# Patient Record
Sex: Female | Born: 1955 | Race: Asian | Hispanic: No | Marital: Married | Smoking: Never smoker
Health system: Southern US, Community
[De-identification: ages and names within clinical notes are randomized; demographics above are authoritative.]

## PROBLEM LIST (undated history)

## (undated) DIAGNOSIS — E7800 Pure hypercholesterolemia, unspecified: Secondary | ICD-10-CM

## (undated) DIAGNOSIS — E119 Type 2 diabetes mellitus without complications: Secondary | ICD-10-CM

## (undated) DIAGNOSIS — I10 Essential (primary) hypertension: Secondary | ICD-10-CM

---

## 2019-11-25 ENCOUNTER — Emergency Department (HOSPITAL_COMMUNITY)
Admission: EM | Admit: 2019-11-25 | Discharge: 2019-11-25 | Disposition: A | Discharge status: Home / Self Care | Payer: BLUE CROSS/BLUE SHIELD | Attending: Emergency Medicine | Admitting: Emergency Medicine

## 2019-11-25 ENCOUNTER — Other Ambulatory Visit: Payer: Self-pay

## 2019-11-25 ENCOUNTER — Emergency Department (HOSPITAL_COMMUNITY): Payer: BLUE CROSS/BLUE SHIELD

## 2019-11-25 ENCOUNTER — Encounter (HOSPITAL_COMMUNITY): Payer: Self-pay | Admitting: Emergency Medicine

## 2019-11-25 DIAGNOSIS — I10 Essential (primary) hypertension: Secondary | ICD-10-CM | POA: Insufficient documentation

## 2019-11-25 DIAGNOSIS — R1032 Left lower quadrant pain: Secondary | ICD-10-CM | POA: Diagnosis not present

## 2019-11-25 DIAGNOSIS — R197 Diarrhea, unspecified: Secondary | ICD-10-CM | POA: Diagnosis not present

## 2019-11-25 DIAGNOSIS — E119 Type 2 diabetes mellitus without complications: Secondary | ICD-10-CM | POA: Diagnosis not present

## 2019-11-25 HISTORY — DX: Type 2 diabetes mellitus without complications: E11.9

## 2019-11-25 HISTORY — DX: Essential (primary) hypertension: I10

## 2019-11-25 HISTORY — DX: Pure hypercholesterolemia, unspecified: E78.00

## 2019-11-25 LAB — CBC
HCT: 41.7 % (ref 36.0–46.0)
Hemoglobin: 13.7 g/dL (ref 12.0–15.0)
MCH: 32.3 pg (ref 26.0–34.0)
MCHC: 32.9 g/dL (ref 30.0–36.0)
MCV: 98.3 fL (ref 80.0–100.0)
Platelets: 248 10*3/uL (ref 150–400)
RBC: 4.24 MIL/uL (ref 3.87–5.11)
RDW: 12.6 % (ref 11.5–15.5)
WBC: 5.1 10*3/uL (ref 4.0–10.5)
nRBC: 0 % (ref 0.0–0.2)

## 2019-11-25 LAB — COMPREHENSIVE METABOLIC PANEL
ALT: 16 U/L (ref 0–44)
AST: 19 U/L (ref 15–41)
Albumin: 4.4 g/dL (ref 3.5–5.0)
Alkaline Phosphatase: 85 U/L (ref 38–126)
Anion gap: 11 (ref 5–15)
BUN: 12 mg/dL (ref 8–23)
CO2: 25 mmol/L (ref 22–32)
Calcium: 9.2 mg/dL (ref 8.9–10.3)
Chloride: 103 mmol/L (ref 98–111)
Creatinine, Ser: 0.49 mg/dL (ref 0.44–1.00)
GFR calc Af Amer: >60 mL/min (ref >60)
GFR calc non Af Amer: >60 mL/min (ref >60)
Glucose, Bld: 104 mg/dL — ABNORMAL HIGH (ref 70–99)
Potassium: 3.9 mmol/L (ref 3.5–5.1)
Sodium: 139 mmol/L (ref 135–145)
Total Bilirubin: 1.1 mg/dL (ref 0.3–1.2)
Total Protein: 6.9 g/dL (ref 6.5–8.1)

## 2019-11-25 LAB — URINALYSIS, ROUTINE W REFLEX MICROSCOPIC
Bilirubin Urine: NEGATIVE
Glucose, UA: NEGATIVE mg/dL
Hgb urine dipstick: NEGATIVE
Ketones, ur: NEGATIVE mg/dL
Leukocytes,Ua: NEGATIVE
Nitrite: NEGATIVE
Protein, ur: NEGATIVE mg/dL
Specific Gravity, Urine: 1.019 (ref 1.005–1.030)
pH: 5 (ref 5.0–8.0)

## 2019-11-25 LAB — LIPASE, BLOOD: Lipase: 30 U/L (ref 11–51)

## 2019-11-25 MED ORDER — OMEPRAZOLE 40 MG PO CPDR: 40.0000 mg | DELAYED_RELEASE_CAPSULE | Freq: Every day | ORAL | 0 refills | Status: AC

## 2019-11-25 MED ORDER — SODIUM CHLORIDE 0.9% FLUSH: 3.0000 mL | Freq: Once | INTRAVENOUS | Status: DC

## 2019-11-25 MED ORDER — METRONIDAZOLE 500 MG PO TABS
500.0000 mg | ORAL_TABLET | Freq: Once | ORAL | Status: AC
  Administered 2019-11-25: 500 mg via ORAL
  Filled 2019-11-25: qty 1

## 2019-11-25 MED ORDER — ATORVASTATIN CALCIUM 40 MG PO TABS: 40.0000 mg | ORAL_TABLET | Freq: Every day | ORAL | 0 refills | Status: AC

## 2019-11-25 MED ORDER — METFORMIN HCL 500 MG PO TABS: 500.0000 mg | ORAL_TABLET | Freq: Two times a day (BID) | ORAL | 0 refills | Status: AC

## 2019-11-25 MED ORDER — METRONIDAZOLE 500 MG PO TABS: 500.0000 mg | ORAL_TABLET | Freq: Three times a day (TID) | ORAL | 0 refills | Status: DC

## 2019-11-25 MED ORDER — IOHEXOL 300 MG/ML  SOLN
100.0000 mL | Freq: Once | INTRAMUSCULAR | Status: AC | PRN
  Administered 2019-11-25: 100 mL via INTRAVENOUS

## 2019-11-25 NOTE — ED Provider Notes (Addendum)
[facility] EMERGENCY DEPARTMENT Provider Note   CSN: [id number] Arrival date & time: 11/25/19  1349     History No chief complaint on file.   Lynn Mathis is a 64 y.o. female.  Patient is a 64 year old female with a history of diabetes, hypertension and hyperlipidemia who is presenting today with 3 weeks of worsening diarrhea and left lower quadrant pain.  Patient reports approximately 3 weeks ago she was having some bladder symptoms and started taking 1 week worth of Macrobid.  She reports that she had this medication on hand from when she has had UTIs in the past but did not see a provider this time.  3 weeks ago when the pain started she said it was severe in the left lower quadrant and took away her appetite.  However in the last 2 weeks she has had approximately 10 diarrhea stools per day some with a small amount and sometimes it is black but it is nonbloody.  The pain in the left lower quadrant is not as severe but is persistent and achy.  She denies any urinary symptoms such as dysuria, frequency or urgency.  She has had no fever, nausea or vomiting.  She has had anorexia and significant decrease in oral intake due to it causing worsening diarrhea.  No prior abdominal surgeries or similar issues in the past.  She denies any cough, shortness of breath, chest pain, rashes, swelling or other complaints.  The history is provided by the patient. The history is limited by a language barrier. A language interpreter was used.       Past Medical History:  Diagnosis Date  . Diabetes mellitus without complication ([place])   . High cholesterol   . Hypertension     There are no problems to display for this patient.   History reviewed. No pertinent surgical history.   OB History   No obstetric history on file.     No family history on file.  Social History   Tobacco Use  . Smoking status: Never Smoker  . Smokeless tobacco: Never Used  Substance Use Topics  .  Alcohol use: Not Currently  . Drug use: Never    Home Medications Prior to Admission medications   Not on File    Allergies    Patient has no allergy information on record.  Review of Systems   Review of Systems  All other systems reviewed and are negative.   Physical Exam Updated Vital Signs BP 122/63 (BP Location: Left Arm)   Pulse 78   Temp 98.4 F (36.9 C) (Oral)   Resp 17   Ht '5\' 3"'$  (1.6 m)   Wt 48.1 kg   SpO2 96%   BMI 18.78 kg/m   Physical Exam Vitals and nursing note reviewed.  Constitutional:      General: She is not in acute distress.    Appearance: Normal appearance. She is well-developed and normal weight.  HENT:     Head: Normocephalic and atraumatic.  Eyes:     Pupils: Pupils are equal, round, and reactive to light.  Cardiovascular:     Rate and Rhythm: Normal rate and regular rhythm.     Heart sounds: Normal heart sounds. No murmur. No friction rub.  Pulmonary:     Effort: Pulmonary effort is normal.     Breath sounds: Normal breath sounds. No wheezing or rales.  Abdominal:     General: Bowel sounds are normal. There is no distension.  Palpations: Abdomen is soft.     Tenderness: There is abdominal tenderness in the left lower quadrant. There is guarding. There is no right CVA tenderness, left CVA tenderness or rebound.  Musculoskeletal:        General: No tenderness. Normal range of motion.     Comments: No edema  Skin:    General: Skin is warm and dry.     Findings: No rash.  Neurological:     General: No focal deficit present.     Mental Status: She is alert and oriented to person, place, and time. Mental status is at baseline.     Cranial Nerves: No cranial nerve deficit.  Psychiatric:        Mood and Affect: Mood normal.        Behavior: Behavior normal.        Thought Content: Thought content normal.     ED Results / Procedures / Treatments   Labs (all labs ordered are listed, but only abnormal results are displayed) Labs  Reviewed  COMPREHENSIVE METABOLIC PANEL - Abnormal; Notable for the following components:      Result Value   Glucose, Bld 104 (*)    All other components within normal limits  C DIFFICILE QUICK SCREEN W PCR REFLEX  LIPASE, BLOOD  CBC  URINALYSIS, ROUTINE W REFLEX MICROSCOPIC    EKG None  Radiology CT ABDOMEN PELVIS W CONTRAST  Result Date: 11/25/2019 CLINICAL DATA:  64 year old female with LEFT abdominal and pelvic pain with diarrhea for 3 weeks. EXAM: CT ABDOMEN AND PELVIS WITH CONTRAST TECHNIQUE: Multidetector CT imaging of the abdomen and pelvis was performed using the standard protocol following bolus administration of intravenous contrast. CONTRAST:  159m OMNIPAQUE IOHEXOL 300 MG/ML  SOLN COMPARISON:  None. FINDINGS: Lower chest: Unremarkable Hepatobiliary: No significant hepatic or gallbladder abnormality. No biliary dilatation. Pancreas: Unremarkable Spleen: Unremarkable Adrenals/Urinary Tract: The kidneys, adrenal glands and bladder are unremarkable. Stomach/Bowel: A 2.3 cm area of slightly irregular soft tissue density lies adjacent to the proximal sigmoid colon (series 3: Images 60 3-66). It is difficult to determine if this represents a soft tissue mass or inflammatory changes. There is no evidence of bowel obstruction, other areas of bowel wall thickening or other inflammatory changes. The appendix is normal. Vascular/Lymphatic: Aortic atherosclerosis. No enlarged abdominal or pelvic lymph nodes. Reproductive: A 7 x 8.5 cm slightly irregular uterine mass is identified. No adnexal masses identified. Other: A 0.5 cm peritoneal nodule the RIGHT LATERAL abdomen is noted (3:44) no ascites, abscess or pneumoperitoneum. Musculoskeletal: No acute or suspicious bony abnormalities are identified. IMPRESSION: 1. 2.3 cm slightly irregular soft tissue density adjacent to the proximal sigmoid colon, favor soft tissue mass over inflammatory changes. Recommend comparison to any prior outside studies  if available. No evidence of bowel obstruction. GI consultation/direct inspection recommended. 2. 7 x 8.5 cm slightly irregular uterine mass-more likely representing a fibroid but gyn consultation recommended. 3. 0.5 cm RIGHT LATERAL peritoneal nodule - nonspecific. 4. Aortic Atherosclerosis (ICD10-I70.0). Electronically Signed   By: JMargarette CanadaM.D.   On: 11/25/2019 17:44    Procedures Procedures (including critical care time)  Medications Ordered in ED Medications  sodium chloride flush (NS) 0.9 % injection 3 mL (has no administration in time range)    ED Course  I have reviewed the triage vital signs and the nursing notes.  Pertinent labs & imaging results that were available during my care of the patient were reviewed by me and considered in my medical decision  making (see chart for details).    MDM Rules/Calculators/A&P                      64 year old female presenting today with 3 weeks of ongoing left lower quadrant pain and numerous episodes of diarrhea.  Patient reports prior to symptoms starting she was taking Macrobid.  She does have left lower quadrant pain with some mild guarding on exam but otherwise looks well.  Vital signs are within normal limits.  However she is had significant decreased oral intake and concern for dehydration.  Patient CBC, CMP, lipase and UA are all within normal limits.  Concern for C. difficile colitis given recent antibiotics and ongoing diarrhea versus diverticulitis.  CT pending.  Patient given IV fluids.  6:54 PM Patient CT showed no evidence of diverticulitis.  There is a 2.3 cm slightly irregular soft tissue density adjacent to the proximal sigmoid colon favoring soft tissue mass over inflammatory changes .  No evidence of bowel obstruction.  Also patient has a fibroid.  Suspect that patient symptoms may be related to C. difficile given recent antibiotic use.  Patient was unable to provide a stool specimen here but will send her home with a stool  kit and she can take that to Dr. Terrence Dupont if she is able to bridge to the stool.  Patient will be started on Flagyl.  Final Clinical Impression(s) / ED Diagnoses Final diagnoses:  Diarrhea, unspecified type    Rx / DC Orders ED Discharge Orders         Ordered    metFORMIN (GLUCOPHAGE) 500 MG tablet  2 times daily with meals     11/25/19 1902    omeprazole (PRILOSEC) 40 MG capsule  Daily     11/25/19 1902    atorvastatin (LIPITOR) 40 MG tablet  Daily     11/25/19 1902    metroNIDAZOLE (FLAGYL) 500 MG tablet  3 times daily     11/25/19 Lilli Light, MD 11/25/19 1708    Blanchie Dessert, MD 11/25/19 1902

## 2019-11-25 NOTE — Discharge Instructions (Signed)
If you start having fever, vomiting or worsening pain you need to return to the emergency room.

## 2019-11-25 NOTE — ED Triage Notes (Signed)
Pt sent from [facility].  C/o LLQ pain and diarrhea x 3 weeks.  Now reports mid lower back pain.  Started after completing antibiotic for UTI.  Had black stools x 1 week that resolved.  Denies nausea and vomiting.  Pt stopped taking all of her Rx meds 2 weeks ago due to abd pain.

## 2019-12-03 ENCOUNTER — Other Ambulatory Visit: Payer: Self-pay

## 2019-12-03 ENCOUNTER — Encounter (HOSPITAL_COMMUNITY): Payer: Self-pay | Admitting: Emergency Medicine

## 2019-12-03 ENCOUNTER — Emergency Department (HOSPITAL_COMMUNITY): Payer: BLUE CROSS/BLUE SHIELD

## 2019-12-03 ENCOUNTER — Emergency Department (HOSPITAL_COMMUNITY)
Admission: EM | Admit: 2019-12-03 | Discharge: 2019-12-03 | Disposition: A | Discharge status: Home / Self Care | Payer: BLUE CROSS/BLUE SHIELD | Attending: Emergency Medicine | Admitting: Emergency Medicine

## 2019-12-03 DIAGNOSIS — I10 Essential (primary) hypertension: Secondary | ICD-10-CM | POA: Diagnosis not present

## 2019-12-03 DIAGNOSIS — E119 Type 2 diabetes mellitus without complications: Secondary | ICD-10-CM | POA: Insufficient documentation

## 2019-12-03 DIAGNOSIS — Z79899 Other long term (current) drug therapy: Secondary | ICD-10-CM | POA: Diagnosis not present

## 2019-12-03 DIAGNOSIS — Z7984 Long term (current) use of oral hypoglycemic drugs: Secondary | ICD-10-CM | POA: Diagnosis not present

## 2019-12-03 DIAGNOSIS — K529 Noninfective gastroenteritis and colitis, unspecified: Secondary | ICD-10-CM | POA: Insufficient documentation

## 2019-12-03 DIAGNOSIS — R112 Nausea with vomiting, unspecified: Secondary | ICD-10-CM | POA: Diagnosis present

## 2019-12-03 LAB — URINALYSIS, ROUTINE W REFLEX MICROSCOPIC
Bacteria, UA: NONE SEEN
Bilirubin Urine: NEGATIVE
Glucose, UA: NEGATIVE mg/dL
Hgb urine dipstick: NEGATIVE
Ketones, ur: 80 mg/dL — AB
Nitrite: NEGATIVE
Protein, ur: 30 mg/dL — AB
Specific Gravity, Urine: 1.027 (ref 1.005–1.030)
pH: 5 (ref 5.0–8.0)

## 2019-12-03 LAB — COMPREHENSIVE METABOLIC PANEL
ALT: 24 U/L (ref 0–44)
AST: 30 U/L (ref 15–41)
Albumin: 4.5 g/dL (ref 3.5–5.0)
Alkaline Phosphatase: 82 U/L (ref 38–126)
Anion gap: 16 — ABNORMAL HIGH (ref 5–15)
BUN: 18 mg/dL (ref 8–23)
CO2: 21 mmol/L — ABNORMAL LOW (ref 22–32)
Calcium: 9.3 mg/dL (ref 8.9–10.3)
Chloride: 101 mmol/L (ref 98–111)
Creatinine, Ser: 0.43 mg/dL — ABNORMAL LOW (ref 0.44–1.00)
GFR calc Af Amer: >60 mL/min (ref >60)
GFR calc non Af Amer: >60 mL/min (ref >60)
Glucose, Bld: 73 mg/dL (ref 70–99)
Potassium: 4 mmol/L (ref 3.5–5.1)
Sodium: 138 mmol/L (ref 135–145)
Total Bilirubin: 1.6 mg/dL — ABNORMAL HIGH (ref 0.3–1.2)
Total Protein: 6.9 g/dL (ref 6.5–8.1)

## 2019-12-03 LAB — CBC
HCT: 43.5 % (ref 36.0–46.0)
Hemoglobin: 14.8 g/dL (ref 12.0–15.0)
MCH: 32.1 pg (ref 26.0–34.0)
MCHC: 34 g/dL (ref 30.0–36.0)
MCV: 94.4 fL (ref 80.0–100.0)
Platelets: 243 10*3/uL (ref 150–400)
RBC: 4.61 MIL/uL (ref 3.87–5.11)
RDW: 12.4 % (ref 11.5–15.5)
WBC: 5.4 10*3/uL (ref 4.0–10.5)
nRBC: 0 % (ref 0.0–0.2)

## 2019-12-03 LAB — LIPASE, BLOOD: Lipase: 28 U/L (ref 11–51)

## 2019-12-03 MED ORDER — SODIUM CHLORIDE 0.9 % IV BOLUS
1000.0000 mL | Freq: Once | INTRAVENOUS | Status: AC
  Administered 2019-12-03: 1000 mL via INTRAVENOUS

## 2019-12-03 MED ORDER — SODIUM CHLORIDE 0.9% FLUSH
3.0000 mL | Freq: Once | INTRAVENOUS | Status: AC
  Administered 2019-12-03: 3 mL via INTRAVENOUS

## 2019-12-03 MED ORDER — IOHEXOL 300 MG/ML  SOLN
100.0000 mL | Freq: Once | INTRAMUSCULAR | Status: AC | PRN
  Administered 2019-12-03: 100 mL via INTRAVENOUS

## 2019-12-03 MED ORDER — SUCRALFATE 1 G PO TABS: 1.0000 g | ORAL_TABLET | Freq: Three times a day (TID) | ORAL | 0 refills | Status: AC

## 2019-12-03 MED ORDER — ONDANSETRON HCL 4 MG/2ML IJ SOLN
4.0000 mg | Freq: Once | INTRAMUSCULAR | Status: AC
  Administered 2019-12-03: 4 mg via INTRAVENOUS
  Filled 2019-12-03: qty 2

## 2019-12-03 MED ORDER — METRONIDAZOLE 500 MG PO TABS: 500.0000 mg | ORAL_TABLET | Freq: Three times a day (TID) | ORAL | 0 refills | Status: AC

## 2019-12-03 NOTE — ED Provider Notes (Signed)
[facility] EMERGENCY DEPARTMENT Provider Note   CSN: [id number] Arrival date & time: 12/03/19  1356     History Chief Complaint  Patient presents with  . Emesis  . Diarrhea    Lynn Mathis is a 64 y.o. female.  HPI    Patient presents for the second time in about 10 days with concern for ongoing weakness, nausea, vomiting, diarrhea, anorexia and abdominal pain.  She notes that since that visit 1 week ago she is actually had some reduction in her abdominal pain, but with worsening of all of the other complaints she returns for additional studies. She brings her discharge paperwork with her.  We discussed findings, she is aware of abnormal CT results, has not yet had a chance to follow-up with other specialist including GI or GU since that visit.  She did see her physician 3 days ago, but it is unclear what changes were made to her medications. She denies fever, denies chest pain, and complains more of weakness then specific pain. History obtained with the assistance of a [place]. Past Medical History:  Diagnosis Date  . Diabetes mellitus without complication ([facility])   . High cholesterol   . Hypertension     There are no problems to display for this patient.   History reviewed. No pertinent surgical history.   OB History   No obstetric history on file.     No family history on file.  Social History   Tobacco Use  . Smoking status: Never Smoker  . Smokeless tobacco: Never Used  Substance Use Topics  . Alcohol use: Not Currently  . Drug use: Never    Home Medications Prior to Admission medications   Medication Sig Start Date End Date Taking? Authorizing Provider  atorvastatin (LIPITOR) 40 MG tablet Take 1 tablet (40 mg total) by mouth daily. 11/25/19   Gwyneth Sprout, MD  metFORMIN (GLUCOPHAGE) 500 MG tablet Take 1 tablet (500 mg total) by mouth 2 (two) times daily with a meal. 11/25/19   Gwyneth Sprout, MD  metroNIDAZOLE (FLAGYL)  500 MG tablet Take 1 tablet (500 mg total) by mouth 3 (three) times daily. 11/25/19   Gwyneth Sprout, MD  nitrofurantoin, macrocrystal-monohydrate, (MACROBID) 100 MG capsule Take 100 mg by mouth 2 (two) times daily.    [provider]  omeprazole (PRILOSEC) 40 MG capsule Take 1 capsule (40 mg total) by mouth daily. 11/25/19   Gwyneth Sprout, MD    Allergies    Patient has no known allergies.  Review of Systems   Review of Systems  Constitutional:       Per HPI, otherwise negative  HENT:       Per HPI, otherwise negative  Respiratory:       Per HPI, otherwise negative  Cardiovascular:       Per HPI, otherwise negative  Gastrointestinal: Positive for abdominal pain, nausea and vomiting.  Endocrine:       Negative aside from HPI  Genitourinary:       Neg aside from HPI   Musculoskeletal:       Per HPI, otherwise negative  Skin: Negative.   Neurological: Positive for weakness. Negative for syncope.    Physical Exam Updated Vital Signs BP (!) 129/51   Pulse 84   Temp 98.3 F (36.8 C) (Oral)   Resp 17   SpO2 96%   Physical Exam Vitals and nursing note reviewed.  Constitutional:      General: She is not in acute distress.  Appearance: She is well-developed.  HENT:     Head: Normocephalic and atraumatic.  Eyes:     Conjunctiva/sclera: Conjunctivae normal.  Cardiovascular:     Rate and Rhythm: Normal rate and regular rhythm.  Pulmonary:     Effort: Pulmonary effort is normal. No respiratory distress.     Breath sounds: Normal breath sounds. No stridor.  Abdominal:     General: There is no distension.     Tenderness: There is no guarding.  Skin:    General: Skin is warm and dry.  Neurological:     Mental Status: She is alert and oriented to person, place, and time.     Cranial Nerves: No cranial nerve deficit.     ED Results / Procedures / Treatments   Labs (all labs ordered are listed, but only abnormal results are displayed) Labs Reviewed    COMPREHENSIVE METABOLIC PANEL - Abnormal; Notable for the following components:      Result Value   CO2 21 (*)    Creatinine, Ser 0.43 (*)    Total Bilirubin 1.6 (*)    Anion gap 16 (*)    All other components within normal limits  URINALYSIS, ROUTINE W REFLEX MICROSCOPIC - Abnormal; Notable for the following components:   Color, Urine AMBER (*)    Ketones, ur 80 (*)    Protein, ur 30 (*)    Leukocytes,Ua SMALL (*)    All other components within normal limits  LIPASE, BLOOD  CBC    EKG None  Radiology CT Abdomen Pelvis W Contrast  Result Date: 12/03/2019 CLINICAL DATA:  Abdominal pain EXAM: CT ABDOMEN AND PELVIS WITH CONTRAST TECHNIQUE: Multidetector CT imaging of the abdomen and pelvis was performed using the standard protocol following bolus administration of intravenous contrast. CONTRAST:  198mL OMNIPAQUE IOHEXOL 300 MG/ML  SOLN COMPARISON:  11/25/2019 FINDINGS: Lower chest: Lung bases are clear. No effusions. Heart is normal size. Hepatobiliary: No focal hepatic abnormality. Gallbladder unremarkable. Pancreas: No focal abnormality or ductal dilatation. Spleen: No focal abnormality.  Normal size. Adrenals/Urinary Tract: No adrenal abnormality. No focal renal abnormality. No stones or hydronephrosis. Urinary bladder is unremarkable. Stomach/Bowel: The previously seen mass adjacent to the sigmoid colon has decreased in size as has surrounding inflammation. This likely represents improving diverticulitis. Minimal soft tissue density remains present adjacent to the sigmoid colon measuring 1.5 cm compared with 2.3 cm previously. This could reflect continued inflammation or represent a diverticulum. Given the decrease in size, favor this reflects improving inflammation/diverticulitis. Normal appendix. Stomach and small bowel decompressed, unremarkable. Vascular/Lymphatic: Aortic atherosclerosis. No enlarged abdominal or pelvic lymph nodes. Reproductive: Enlarged uterus with irregular enhancing  central mass, likely large fibroid. No adnexal mass. Other: No free fluid or free air. Musculoskeletal: No acute bony abnormality. IMPRESSION: Decreasing size of the soft tissue mass adjacent to the sigmoid colon. There also appears to be improving inflammation. Findings likely reflect improving diverticulitis. Fibroid uterus. No acute findings. Electronically Signed   By: Rolm Baptise M.D.   On: 12/03/2019 19:24    Procedures Procedures (including critical care time)  Medications Ordered in ED Medications  sodium chloride flush (NS) 0.9 % injection 3 mL (3 mLs Intravenous Given 12/03/19 1751)  sodium chloride 0.9 % bolus 1,000 mL (1,000 mLs Intravenous New Bag/Given 12/03/19 1846)  sodium chloride 0.9 % bolus 1,000 mL (0 mLs Intravenous Stopped 12/03/19 1846)  ondansetron (ZOFRAN) injection 4 mg (4 mg Intravenous Given 12/03/19 1751)  iohexol (OMNIPAQUE) 300 MG/ML solution 100 mL (100 mLs Intravenous  Contrast Given 12/03/19 1907)    ED Course  I have reviewed the triage vital signs and the nursing notes.  Pertinent labs & imaging results that were available during my care of the patient were reviewed by me and considered in my medical decision making (see chart for details).   Chart review after initial visit notable for CT results 1 week ago with 2 notable abnormalities, 1 lesion in the proximal sigmoid colon, second near the uterus, both of which require additional evaluation.  8:21 PM Patient in no distress, hemodynamically unremarkable.  She has received 2 L fluid resuscitation and heart rate has gone from 10 5-84.  She is in no distress, has had no episodes of vomiting, no reported loose stool.  We reviewed CT results with which generally reflect likely decrease in inflammation.  No other acute findings.  Patient amenable to discharge.  She has been taking Cipro for presumed C. difficile colitis, will continue this medication and will follow up with primary care.  Absent other abdominal pain,  fever, substantial lab abnormalities, no evidence for peritonitis, bacteremia, sepsis.  Final Clinical Impression(s) / ED Diagnoses Final diagnoses:  Colitis     Gerhard Munch, MD 12/03/19 2024

## 2019-12-03 NOTE — Discharge Instructions (Signed)
??? ??? ????????. ?? ???? ???? ?????? ???? ????. ?? ??? ?? ?? ?? ??????. ??? ???? ???? ?? ????? ?? ?????. ??? ??? ?? ?? ??? ??? ???? ??????.    Today's evaluation has been reassuring.  You are likely recovering from your recent infection appropriately.  Please continue to take all medication as prescribed. It is important that you have a follow-up visit with your doctor and a gynecologist. Return to the Emergency Department for any concerning changes in your condition.

## 2019-12-03 NOTE — ED Notes (Signed)
Patient transported to CT 

## 2019-12-03 NOTE — ED Notes (Addendum)
No active vomiting/diarrhea while waiting for treatment room.

## 2019-12-03 NOTE — ED Triage Notes (Signed)
Pt seen here 3/13 for LLQ pain and diarrhea x 3 weeks.  States the pain has improved but she continues to have vomiting and diarrhea.

## 2021-02-12 DEATH — deceased

## 2021-08-13 IMAGING — CT CT ABD-PELV W/ CM
2 of 5 series · 15 of 46 positions shown, 17 images · IV contrast (APPLIED)
Comparison: 11/25/2019

CLINICAL DATA: Abdominal pain

EXAM:
CT ABDOMEN AND PELVIS WITH CONTRAST
TECHNIQUE: Multidetector CT imaging of the abdomen and pelvis was performed
using the standard protocol following bolus administration of
intravenous contrast.
CONTRAST:  100mL OMNIPAQUE IOHEXOL 300 MG/ML  SOLN

[Series 3: abdomen 5.0 · axial · 0.64mm/px · z∈[-846,-436]mm · 12 of 98 slices shown, 14 images]
[im 8/98  soft-tissue]
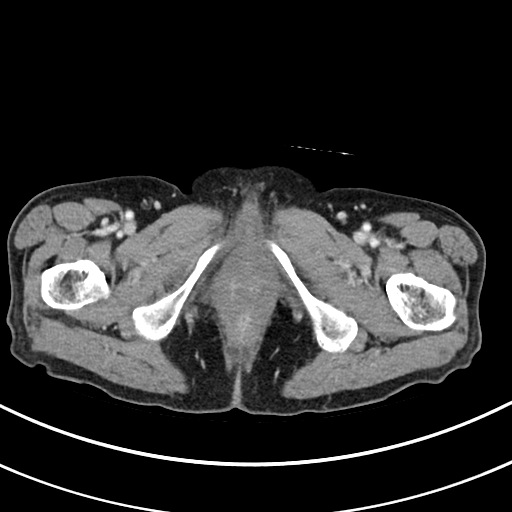
[im 8/98  bone]
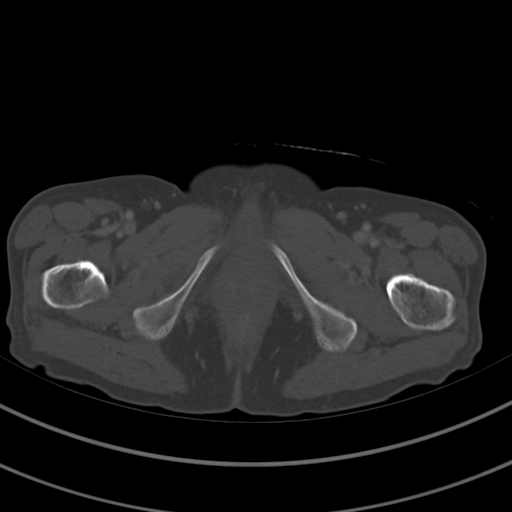
[im 15/98  soft-tissue]
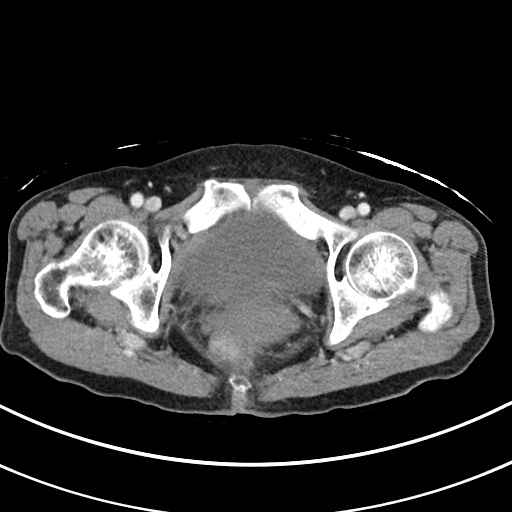
[im 23/98  soft-tissue]
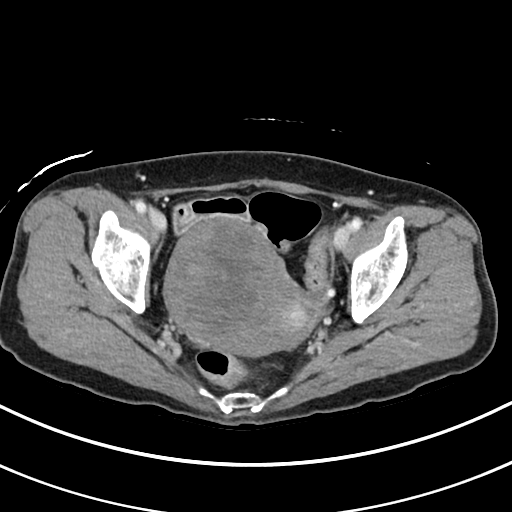
[im 30/98  soft-tissue]
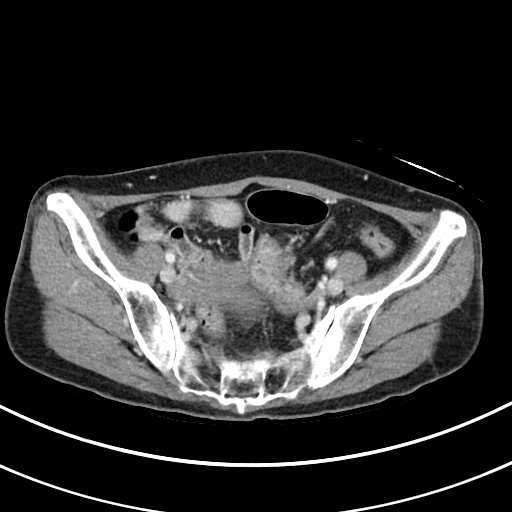
[im 38/98  soft-tissue]
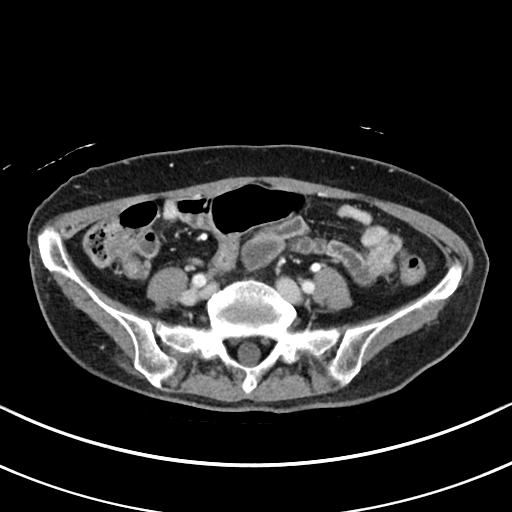
[im 45/98  soft-tissue]
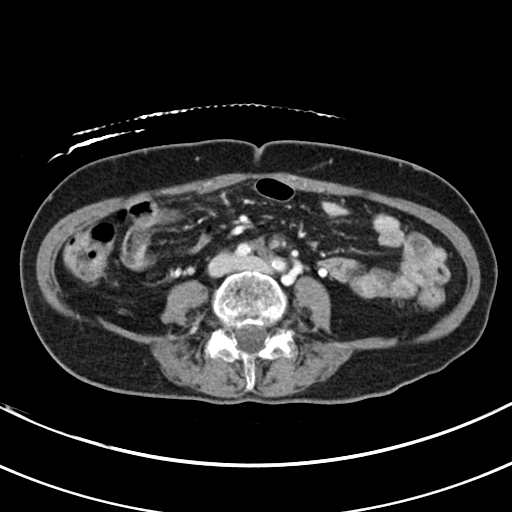
[im 53/98  soft-tissue]
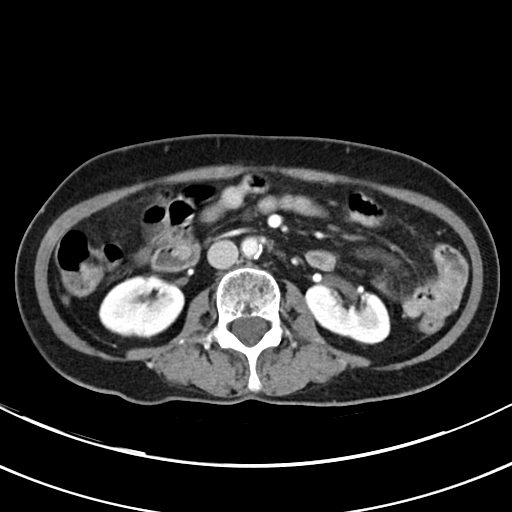
[im 60/98  soft-tissue]
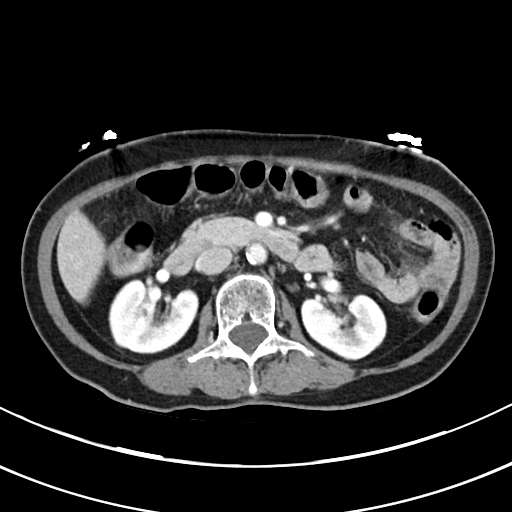
[im 68/98  soft-tissue]
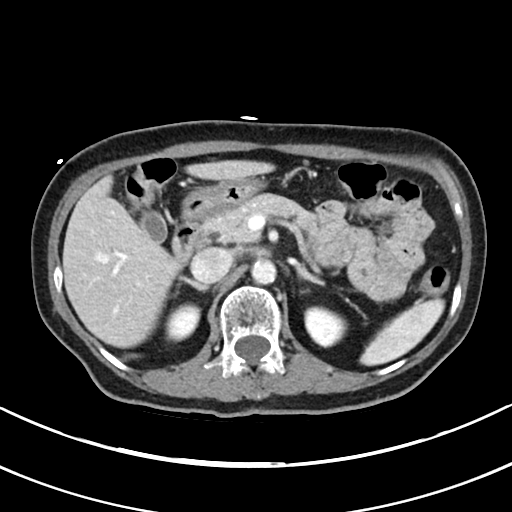
[im 68/98  bone]
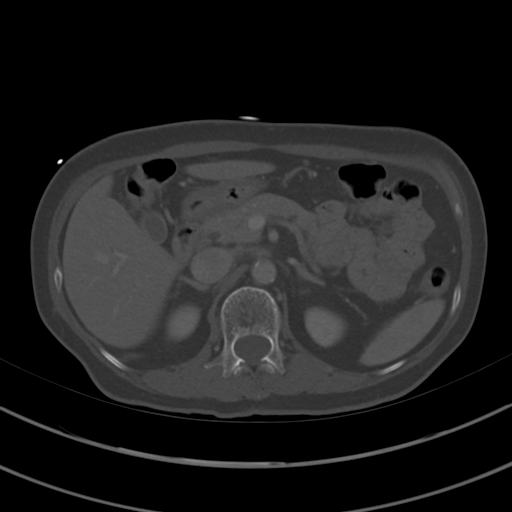
[im 75/98  soft-tissue]
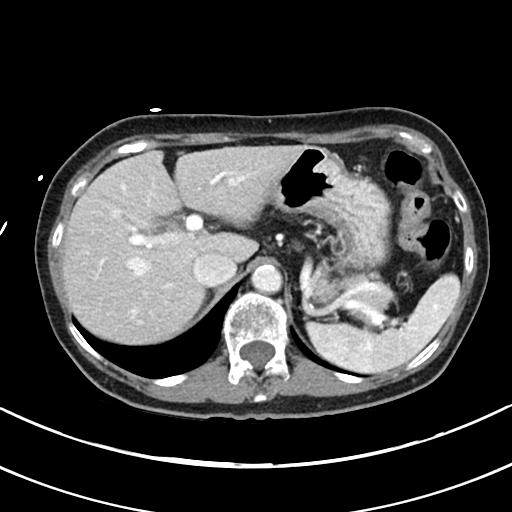
[im 83/98  soft-tissue]
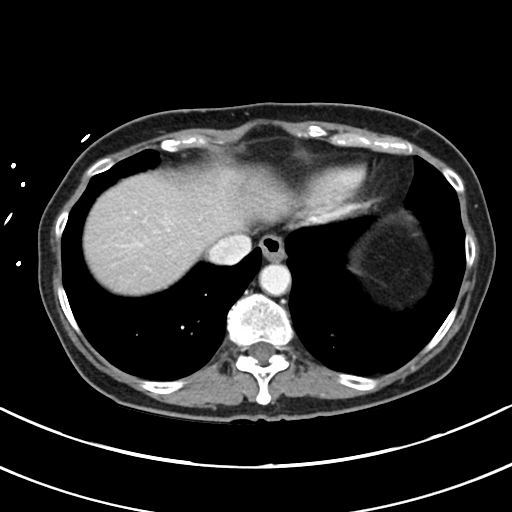
[im 90/98  soft-tissue]
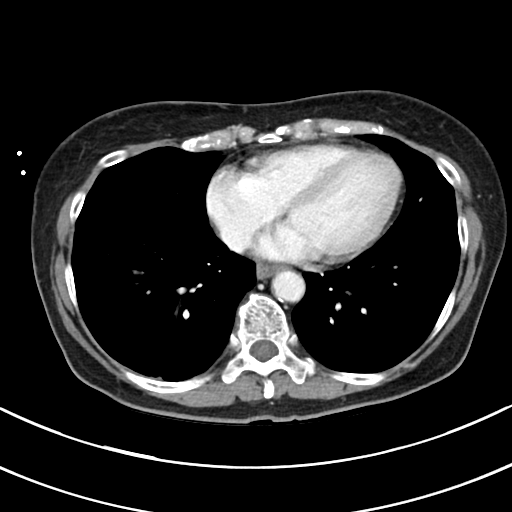

[Series 6: abdomen 3.0 mpr cor · coronal · 0.64mm/px · 3 of 67 slices shown]
[im 23/67  soft-tissue]
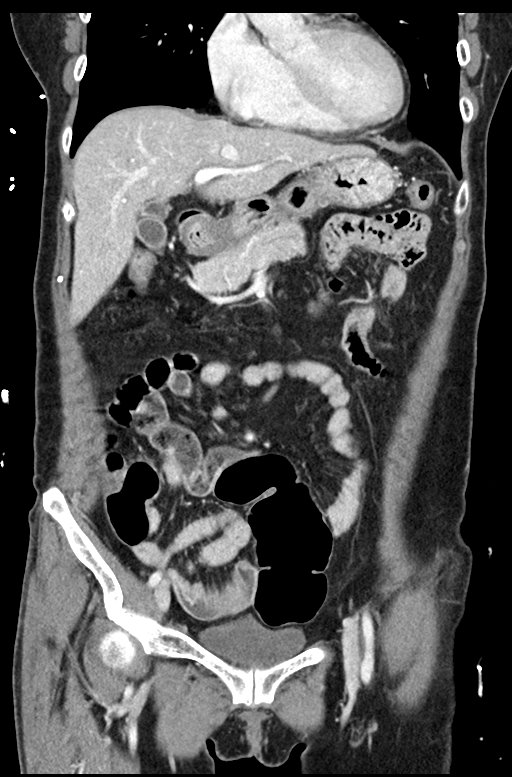
[im 30/67  soft-tissue]
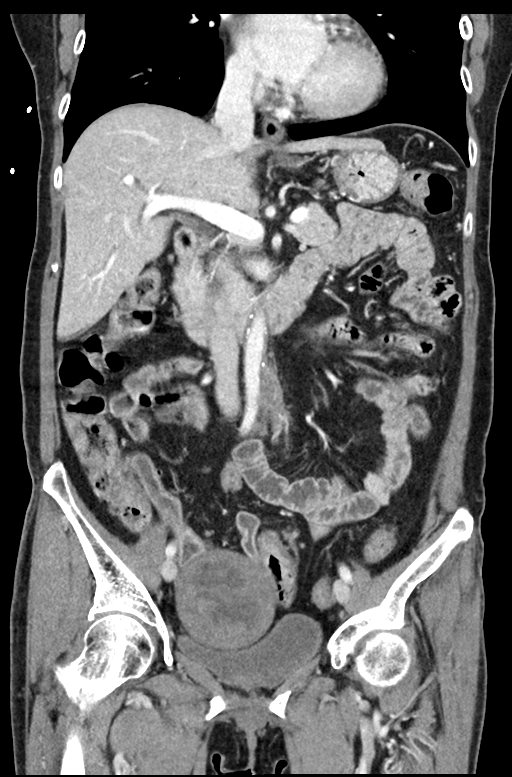
[im 37/67  soft-tissue]
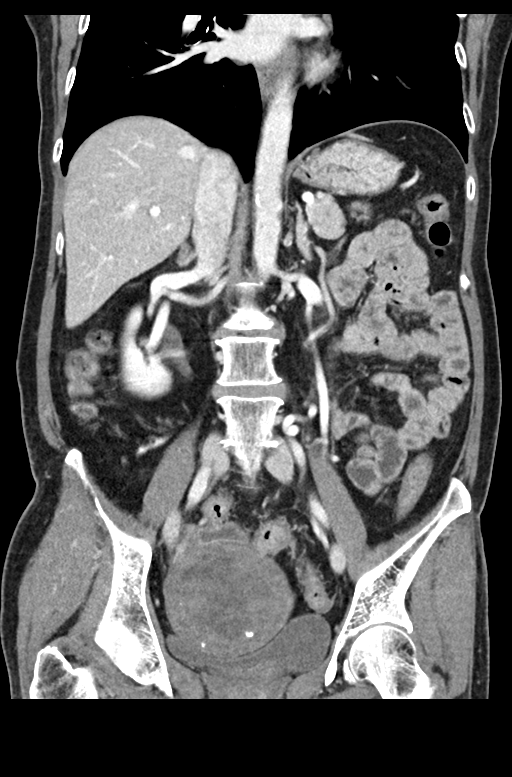

[15 of 46 positions shown; findings below may reference images not displayed]

FINDINGS: Lower chest: Lung bases are clear. No effusions. Heart is normal
size.

Hepatobiliary: No focal hepatic abnormality. Gallbladder
unremarkable.

Pancreas: No focal abnormality or ductal dilatation.

Spleen: No focal abnormality.  Normal size.

Adrenals/Urinary Tract: No adrenal abnormality. No focal renal
abnormality. No stones or hydronephrosis. Urinary bladder is
unremarkable.

Stomach/Bowel: The previously seen mass adjacent to the sigmoid
colon has decreased in size as has surrounding inflammation. This
likely represents improving diverticulitis. Minimal soft tissue
density remains present adjacent to the sigmoid colon measuring
cm compared with 2.3 cm previously. This could reflect continued
inflammation or represent a diverticulum. Given the decrease in
size, favor this reflects improving inflammation/diverticulitis.
Normal appendix. Stomach and small bowel decompressed, unremarkable.

Vascular/Lymphatic: Aortic atherosclerosis. No enlarged abdominal or
pelvic lymph nodes.

Reproductive: Enlarged uterus with irregular enhancing central mass,
likely large fibroid. No adnexal mass.

Other: No free fluid or free air.

Musculoskeletal: No acute bony abnormality.
IMPRESSION: Decreasing size of the soft tissue mass adjacent to the sigmoid
colon. There also appears to be improving inflammation. Findings
likely reflect improving diverticulitis.

Fibroid uterus.

No acute findings.
